# Patient Record
Sex: Male | Born: 1945 | Race: White | Hispanic: No | Marital: Married
Health system: Southern US, Community
[De-identification: ages and names within clinical notes are randomized; demographics above are authoritative.]

## PROBLEM LIST (undated history)

## (undated) DIAGNOSIS — E119 Type 2 diabetes mellitus without complications: Secondary | ICD-10-CM

## (undated) DIAGNOSIS — I10 Essential (primary) hypertension: Secondary | ICD-10-CM

## (undated) DIAGNOSIS — J45909 Unspecified asthma, uncomplicated: Secondary | ICD-10-CM

---

## 2012-11-04 ENCOUNTER — Ambulatory Visit (HOSPITAL_COMMUNITY)
Admission: RE | Admit: 2012-11-04 | Discharge: 2012-11-04 | Disposition: A | Discharge status: Home / Self Care | Payer: Self-pay | Source: Ambulatory Visit | Attending: Preventative Medicine | Admitting: Preventative Medicine

## 2012-11-04 DIAGNOSIS — M25569 Pain in unspecified knee: Secondary | ICD-10-CM | POA: Insufficient documentation

## 2012-11-04 DIAGNOSIS — IMO0001 Reserved for inherently not codable concepts without codable children: Secondary | ICD-10-CM | POA: Insufficient documentation

## 2012-11-04 NOTE — Evaluation (Signed)
Physical Therapy Evaluation  Patient Details  Name: Terry Nguyen MRN: [id number] Date of Birth: [birth year]  Today's Date: 11/04/2012 Time: 0800-0842 PT Time Calculation (min): 42 min Charges: 1 eval, 15' Self Care Visit#: 1  of 4   Re-eval:   Assessment Diagnosis: R knee pain Surgical Date: 10/18/12 Next MD Visit: Dr. Wende Crease - 11/12/11  Authorization: Workers Compensation  Authorization Time Period:    Authorization Visit#: 1  of 4    Subjective Symptoms/Limitations Symptoms: PMH: DMII, GERD, HTN -controlled with medication Pertinent History: Pt is referred to PT for R knee pain which he reports gave way to him on Dec. 16th.  He reports that he was climbing a ladder and it gave way.  His c/co is pain behind the knee.  He continues to work and has been placed on light duty.  He reports that he went to see an orthopedic surgeon in [facility] (@ [facility]) due to increased effusion of his knee. He reports that they were able to draw fluid from his knee.  he reports that they suspect an ACL tear.  How long can you sit comfortably?: no difficulty, no difficulty with driving. How long can you stand comfortably?: uncomfortable by the end of the day.  How long can you walk comfortably?: uncomfortable by the end of the day.  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  Requires: climbing, crawling, walking long distances, standing and painting Comments: He enjoys riding motorcycles.   Cognition/Observation Observation/Other Assessments Observations: comes in with knee brace after seeing his an orthopedic surgeon.  Other Assessments: increased pain with squating and lunges w/improper mechanics, favors R LE.   Sensation/Coordination/Flexibility/Functional Tests Functional  Tests Functional Tests: + R Lachman's Test, lax end feel w/anterior drawer, - varus, - valgus, - apley's compression and distraction, inconclusive McMurray's due to pt unable to relax secondary to pain Functional Tests: LEFS: 44/80  Assessment RLE Assessment RLE Assessment: Within Functional Limits RLE AROM (degrees) RLE Overall AROM Comments: comfort position of knee lacking 5 degrees TKE Right Knee Extension: 0  Right Knee Flexion: 122  LLE Assessment LLE Assessment: Within Functional Limits Palpation Palpation: pain and tenderness to R popliteal fossa and patella.   Exercise/Treatments Stretches Gastroc Stretch: 1 rep;30 seconds Soleus Stretch: 1 rep;30 seconds Standing Heel Raises: 10 reps Functional Squat: 10 reps Other Standing Knee Exercises: Lunges x5 BLE Supine Straight Leg Raises: Right;10 reps  Physical Therapy Assessment and Plan PT Assessment and Plan Clinical Impression Statement: Pt is a 67 year old male referred to PT for R knee pain while climbing a ladder at work. After evaluation it was found that he has significant + tests for ACL laxity Pt will benefit from skilled therapeutic intervention in order to improve on the following deficits: Pain;Improper body mechanics;Impaired perceived functional ability;Abnormal gait Rehab Potential: Fair PT Frequency: Min 3X/week PT Duration: Other (comment) (1 week) PT Treatment/Interventions: Functional mobility training;Therapeutic exercise;Stair training;Balance training;Gait training;Neuromuscular re-education;Patient/family education;Manual techniques;Modalities PT Plan: Focus on improving overall body mechanics with lifting, stair training, squating, reduce pain with modalities and manual techniques as able. Continue to progress strengtheing for knee stability: 4 way SLR, standing TKE, active HS stretch, quad stretch    Goals Home Exercise Program Pt will Perform Home Exercise Program: Independently PT Goal: Perform  Home Exercise Program - Progress: Goal set  today PT Short Term Goals Time to Complete Short Term Goals: Other (comment) (1 week) PT Short Term Goal 1: Pt will report pain less than 3/10 with lunging and squating activities.  PT Short Term Goal 2: Pt will be educated on proper lifting techniques and equipment for safe return to work to decrease knee pain.   Problem List Patient Active Problem List  Diagnosis  . Knee pain   PT Plan of Care PT Home Exercise Plan: see scanned report PT Patient Instructions: importance of HEP, ice and body mechanics, discussed importance of proper kneeling pads and knee pads to decrease knee pain.  Consulted and Agree with Plan of Care: Patient  Annett Fabian, PT 11/04/2012, 4:04 PM  Physician Documentation Your signature is required to indicate approval of the treatment plan as stated above.  Please sign and either send electronically or make a copy of this report for your files and return this physician signed original.   Please mark one 1.__approve of plan  2. ___approve of plan with the following conditions.   ______________________________                                                          _____________________ Physician Signature                                                                                                             Date

## 2012-11-08 ENCOUNTER — Ambulatory Visit (HOSPITAL_COMMUNITY)
Admission: RE | Admit: 2012-11-08 | Discharge: 2012-11-08 | Disposition: A | Discharge status: Home / Self Care | Payer: Self-pay | Source: Ambulatory Visit | Attending: Preventative Medicine | Admitting: Preventative Medicine

## 2012-11-08 NOTE — Progress Notes (Signed)
Physical Therapy Treatment Patient Details  Name: Terry Nguyen MRN: [id number] Date of Birth: 14-Oct-1946  Today's Date: 11/08/2012 Time: 0835-0900 PT Time Calculation (min): 25 min Visit#: 2  of 4   Authorization: Workers Engineer, civil (consulting) Visit#: 2  of 4   Charges:  therex 24'  Subjective: Symptoms/Limitations Symptoms: Pt. states his pain is about the same today.  Currently 3/10 but only with walking; no pain at rest, just discomfort.   Exercise/Treatments Aerobic Stationary Bike: 6' @ 3.0 seat 12 Standing Heel Raises: 10 reps;Limitations Heel Raises Limitations: toeraises 10 reps Forward Lunges: Both;5 reps Lateral Step Up: 10 reps;Step Height: 4";Hand Hold: 0;Right Forward Step Up: 10 reps;Step Height: 4";Hand Hold: 0;Right Step Down: 10 reps;Step Height: 4";Hand Hold: 0;Right Functional Squat: 10 reps Supine Straight Leg Raises: Right;10 reps Sidelying Hip ABduction: 10 reps;Right Hip ADduction: 10 reps;Right Prone  Hip Extension: 10 reps;Right      Physical Therapy Assessment and Plan PT Assessment and Plan Clinical Impression Statement: Pt. able to complete all exercises without c/o pain or difficulty.  Required multimodal cues to complete lunges and squats correctly.  Added new exercises per PT POC; pt. denied need for ice or pain control at end of session. PT Duration:  (1 week) PT Plan: Focus on improving overall body mechanics with lifting and LE strengthening; reduce pain with modalities and manual techniques as needed.Add standing TKE, continue with HS stretch, quad stretch.     Problem List Patient Active Problem List  Diagnosis  . Knee pain    PT - End of Session Activity Tolerance: Patient tolerated treatment well General Behavior During Session: [facility] for tasks performed Cognition: [facility] for tasks performed PT Plan of Care   Lurena Nida, PTA/CLT 11/08/2012, 9:02 AM

## 2012-11-09 ENCOUNTER — Ambulatory Visit (HOSPITAL_COMMUNITY)
Admission: RE | Admit: 2012-11-09 | Discharge: 2012-11-09 | Disposition: A | Discharge status: Home / Self Care | Payer: Self-pay | Source: Ambulatory Visit | Attending: Preventative Medicine | Admitting: Preventative Medicine

## 2012-11-09 NOTE — Progress Notes (Signed)
Physical Therapy Treatment Patient Details  Name: Terry Nguyen MRN: [id number] Date of Birth: 24-Dec-1945  Today's Date: 11/09/2012 Time: 0850-0930 PT Time Calculation (min): 40 min  Visit#: 3  of 4   Re-eval:   Assessment Diagnosis: R knee pain Surgical Date: 10/18/12 Next MD Visit: Dr. Wende Crease - 11/12/11 Charge: therex 38'  Authorization: Workers Compensation  Authorization Time Period:    Authorization Visit#: 3  of 4    Subjective: Symptoms/Limitations Symptoms: Pt stated pain about the same.  Pain scale 3/10 R knee Pain Assessment Currently in Pain?: Yes Pain Score:   3 Pain Location: Knee Pain Orientation: Right  Objective:  Exercise/Treatments Stretches Active Hamstring Stretch: 3 reps;30 seconds Quad Stretch: 3 reps;30 seconds Aerobic Stationary Bike: 8' @ 3.0 seat 10 Standing Heel Raises: 15 reps;Limitations Heel Raises Limitations: toeraises 15 reps Forward Lunges: Both;10 reps Terminal Knee Extension: Both;10 reps;Theraband Theraband Level (Terminal Knee Extension): Level 4 (Blue) Lateral Step Up: 10 reps;Step Height: 4";Hand Hold: 0;Right Forward Step Up: 10 reps;Step Height: 4";Hand Hold: 0;Right Step Down: 10 reps;Step Height: 4";Hand Hold: 0;Right Functional Squat: Limitations Functional Squat Limitations: proper lifting 10x 8# box Supine Bridges: 15 reps Straight Leg Raises: Right;20 reps Sidelying Hip ABduction: Right;15 reps Hip ADduction: Right;15 reps Prone  Hip Extension: Right;15 reps      Physical Therapy Assessment and Plan PT Assessment and Plan Clinical Impression Statement: Pt able to complete all therex with no c/o pain, did require multimodal cueing for proper form/technique with forward lunges and progressed squats to proper lifting for functional strengthening with cueing for form.  Pt denied need to ice or pain control at end of session. PT Plan: Reeval next session per 3 OPPT session per workers comp and prior MD apt on  11/11/2012.  Focus on improving overall body mechanics with lifting and LE strengthening; reduce pain with modalities and manual techniques as needed    Goals    Problem List Patient Active Problem List  Diagnosis  . Knee pain    PT - End of Session Activity Tolerance: Patient tolerated treatment well General Behavior During Session: [facility] for tasks performed Cognition: [facility] for tasks performed  GP    Terry, Nguyen 11/09/2012, 9:43 AM

## 2012-11-10 ENCOUNTER — Ambulatory Visit (HOSPITAL_COMMUNITY)
Admission: RE | Admit: 2012-11-10 | Discharge: 2012-11-10 | Disposition: A | Discharge status: Home / Self Care | Payer: Self-pay | Source: Ambulatory Visit | Attending: Preventative Medicine | Admitting: Preventative Medicine

## 2012-11-10 NOTE — Progress Notes (Signed)
Physical Therapy Re-evaluation/treatment  Patient Details  Name: Terry Nguyen MRN: [id number] Date of Birth: 1946-09-19  Today's Date: 11/10/2012 Time: 0850-0928 PT Time Calculation (min): 38 min  Visit#: 4  of 4   Re-eval:   Assessment Diagnosis: R knee pain Surgical Date: 10/18/12 Next MD Visit: Dr. Wende Crease - 11/12/11 Charge: therex 30', self care 8'  Authorization: Workers Pharmacist, community Time Period:    Authorization Visit#: 4  of 4    Subjective Symptoms/Limitations Symptoms: Pt reported increased knee pain yesterday with exercises. Pain Assessment Currently in Pain?: Yes Pain Score:   4 Pain Location: Knee Pain Orientation: Right  Objective:   Sensation/Coordination/Flexibility/Functional Tests Functional Tests Functional Tests: + R Lachman's Test, lax end feel w/anterior drawer, - varus, - valgus, - apley's compression and distraction Functional Tests: LEFS 47/80 (was 44/80)  Assessment RLE Assessment RLE Assessment: Within Functional Limits  Exercise/Treatments Stretches Active Hamstring Stretch: 3 reps;30 seconds Quad Stretch: 3 reps;30 seconds Gastroc Stretch: 3 reps;30 seconds Aerobic Stationary Bike: 8' @ 3.0 seat 10 Standing Heel Raises: 15 reps;Limitations Heel Raises Limitations: toeraises 15 reps Terminal Knee Extension: Both;10 reps;Theraband Theraband Level (Terminal Knee Extension): Level 4 (Blue) Lateral Step Up: Right;10 reps;Hand Hold: 0;Step Height: 6" Forward Step Up: Right;10 reps;Hand Hold: 0;Step Height: 6" Step Down: Right;10 reps;Hand Hold: 0;Step Height: 6" Functional Squat: Limitations Functional Squat Limitations: proper lifting 10x 8# box    Physical Therapy Assessment and Plan PT Assessment and Plan Clinical Impression Statement: 3rd session per workers' compensation, reviewed PT goals.  Pt continues to require max cueing for proper form with functional squats to reduce stress B knees.  Pt educated on proper form  and technique and stated pain reduced once completeing good form.  Pt with improved perceived LE functional scale by 3 points.  Pt encouraged to increase frequency on HEP for maximum benefits. PT Plan: D/C to HEP per 3rd PT visit, pt to return to MD tomorrow.    Goals Home Exercise Program Pt will Perform Home Exercise Program: Independently PT Goal: Perform Home Exercise Program - Progress: Met (HEP completed daily) PT Short Term Goals Time to Complete Short Term Goals: Other (comment) (1 week) PT Short Term Goal 1: Pt will report pain less than 3/10 with lunging and squating activities.  PT Short Term Goal 1 - Progress: Not met (squatting increase pain to 6-7/10;stated 3/10 with good form) PT Short Term Goal 2: Pt will be educated on proper lifting techniques and equipment for safe return to work to decrease knee pain.  PT Short Term Goal 2 - Progress: Progressing toward goal (requires max cueing for correct form)  Problem List Patient Active Problem List  Diagnosis  . Knee pain    PT - End of Session Activity Tolerance: Patient tolerated treatment well General Behavior During Session: [facility] for tasks performed Cognition: [facility] for tasks performed  GP    TRUE, Garciamartinez 11/10/2012, 11:31 AM  Physician Documentation Your signature is required to indicate approval of the treatment plan as stated above.  Please sign and either send electronically or make a copy of this report for your files and return this physician signed original.   Please mark one 1.__approve of plan  2. ___approve of plan with the following conditions.   ______________________________  _____________________ Physician Signature                                                                                                             Date

## 2012-11-11 ENCOUNTER — Inpatient Hospital Stay (HOSPITAL_COMMUNITY): Admission: RE | Admit: 2012-11-11 | Payer: Self-pay | Source: Ambulatory Visit

## 2012-11-12 ENCOUNTER — Ambulatory Visit (HOSPITAL_COMMUNITY): Payer: Self-pay | Admitting: Physical Therapy

## 2015-02-14 ENCOUNTER — Other Ambulatory Visit: Payer: Self-pay | Admitting: Hematology

## 2015-06-25 ENCOUNTER — Emergency Department (HOSPITAL_COMMUNITY)
Admission: EM | Admit: 2015-06-25 | Discharge: 2015-06-25 | Disposition: A | Discharge status: Home / Self Care | Payer: Medicare (Managed Care) | Attending: Emergency Medicine | Admitting: Emergency Medicine

## 2015-06-25 ENCOUNTER — Emergency Department (HOSPITAL_COMMUNITY): Payer: Medicare (Managed Care)

## 2015-06-25 ENCOUNTER — Encounter (HOSPITAL_COMMUNITY): Payer: Self-pay | Admitting: Emergency Medicine

## 2015-06-25 DIAGNOSIS — N400 Enlarged prostate without lower urinary tract symptoms: Secondary | ICD-10-CM | POA: Diagnosis not present

## 2015-06-25 DIAGNOSIS — I10 Essential (primary) hypertension: Secondary | ICD-10-CM | POA: Diagnosis not present

## 2015-06-25 DIAGNOSIS — R2241 Localized swelling, mass and lump, right lower limb: Secondary | ICD-10-CM | POA: Insufficient documentation

## 2015-06-25 DIAGNOSIS — R109 Unspecified abdominal pain: Secondary | ICD-10-CM

## 2015-06-25 DIAGNOSIS — J45909 Unspecified asthma, uncomplicated: Secondary | ICD-10-CM | POA: Diagnosis not present

## 2015-06-25 DIAGNOSIS — N320 Bladder-neck obstruction: Secondary | ICD-10-CM

## 2015-06-25 DIAGNOSIS — E119 Type 2 diabetes mellitus without complications: Secondary | ICD-10-CM | POA: Diagnosis not present

## 2015-06-25 DIAGNOSIS — R110 Nausea: Secondary | ICD-10-CM

## 2015-06-25 DIAGNOSIS — Z880 Allergy status to penicillin: Secondary | ICD-10-CM | POA: Insufficient documentation

## 2015-06-25 HISTORY — DX: Unspecified asthma, uncomplicated: J45.909

## 2015-06-25 HISTORY — DX: Essential (primary) hypertension: I10

## 2015-06-25 HISTORY — DX: Type 2 diabetes mellitus without complications: E11.9

## 2015-06-25 LAB — CBC WITH DIFFERENTIAL/PLATELET
BASOS ABS: 0 10*3/uL (ref 0.0–0.1)
BASOS PCT: 0 % (ref 0–1)
EOS ABS: 0.1 10*3/uL (ref 0.0–0.7)
EOS PCT: 1 % (ref 0–5)
HCT: 47.8 % (ref 39.0–52.0)
Hemoglobin: 17.3 g/dL — ABNORMAL HIGH (ref 13.0–17.0)
Lymphocytes Relative: 19 % (ref 12–46)
Lymphs Abs: 1.5 10*3/uL (ref 0.7–4.0)
MCH: 31.1 pg (ref 26.0–34.0)
MCHC: 36.2 g/dL — ABNORMAL HIGH (ref 30.0–36.0)
MCV: 86 fL (ref 78.0–100.0)
MONO ABS: 0.5 10*3/uL (ref 0.1–1.0)
Monocytes Relative: 6 % (ref 3–12)
Neutro Abs: 5.9 10*3/uL (ref 1.7–7.7)
Neutrophils Relative %: 74 % (ref 43–77)
PLATELETS: 205 10*3/uL (ref 150–400)
RBC: 5.56 MIL/uL (ref 4.22–5.81)
RDW: 13.3 % (ref 11.5–15.5)
WBC: 8 10*3/uL (ref 4.0–10.5)

## 2015-06-25 LAB — URINE MICROSCOPIC-ADD ON

## 2015-06-25 LAB — URINALYSIS, ROUTINE W REFLEX MICROSCOPIC
BILIRUBIN URINE: NEGATIVE
Glucose, UA: >1000 mg/dL — AB
HGB URINE DIPSTICK: NEGATIVE
KETONES UR: NEGATIVE mg/dL
Leukocytes, UA: NEGATIVE
Nitrite: NEGATIVE
Protein, ur: NEGATIVE mg/dL
SPECIFIC GRAVITY, URINE: 1.024 (ref 1.005–1.030)
UROBILINOGEN UA: 0.2 mg/dL (ref 0.0–1.0)
pH: 6 (ref 5.0–8.0)

## 2015-06-25 LAB — COMPREHENSIVE METABOLIC PANEL
ALBUMIN: 3.7 g/dL (ref 3.5–5.0)
ALK PHOS: 95 U/L (ref 38–126)
ALT: 23 U/L (ref 17–63)
AST: 19 U/L (ref 15–41)
Anion gap: 11 (ref 5–15)
BILIRUBIN TOTAL: 0.7 mg/dL (ref 0.3–1.2)
BUN: 9 mg/dL (ref 6–20)
CALCIUM: 9.1 mg/dL (ref 8.9–10.3)
CO2: 24 mmol/L (ref 22–32)
CREATININE: 0.81 mg/dL (ref 0.61–1.24)
Chloride: 99 mmol/L — ABNORMAL LOW (ref 101–111)
GFR calc Af Amer: >60 mL/min (ref >60)
GLUCOSE: 340 mg/dL — AB (ref 65–99)
Potassium: 4.3 mmol/L (ref 3.5–5.1)
Sodium: 134 mmol/L — ABNORMAL LOW (ref 135–145)
TOTAL PROTEIN: 7.4 g/dL (ref 6.5–8.1)

## 2015-06-25 LAB — TROPONIN I

## 2015-06-25 LAB — LIPASE, BLOOD: LIPASE: 42 U/L (ref 22–51)

## 2015-06-25 LAB — CBG MONITORING, ED: Glucose-Capillary: 296 mg/dL — ABNORMAL HIGH (ref 65–99)

## 2015-06-25 MED ORDER — PANTOPRAZOLE SODIUM 40 MG IV SOLR
40.0000 mg | Freq: Once | INTRAVENOUS | Status: AC
  Administered 2015-06-25: 40 mg via INTRAVENOUS
  Filled 2015-06-25: qty 40

## 2015-06-25 MED ORDER — PANTOPRAZOLE SODIUM 20 MG PO TBEC: 20.0000 mg | DELAYED_RELEASE_TABLET | Freq: Every day | ORAL | Status: AC

## 2015-06-25 MED ORDER — METOCLOPRAMIDE HCL 5 MG/ML IJ SOLN
10.0000 mg | Freq: Once | INTRAMUSCULAR | Status: AC
  Administered 2015-06-25: 10 mg via INTRAVENOUS
  Filled 2015-06-25: qty 2

## 2015-06-25 MED ORDER — SODIUM CHLORIDE 0.9 % IV BOLUS (SEPSIS)
1000.0000 mL | Freq: Once | INTRAVENOUS | Status: AC
  Administered 2015-06-25: 1000 mL via INTRAVENOUS

## 2015-06-25 MED ORDER — IOHEXOL 300 MG/ML  SOLN
100.0000 mL | Freq: Once | INTRAMUSCULAR | Status: AC | PRN
  Administered 2015-06-25: 100 mL via INTRAVENOUS

## 2015-06-25 MED ORDER — ONDANSETRON HCL 4 MG/2ML IJ SOLN
4.0000 mg | Freq: Once | INTRAMUSCULAR | Status: AC
  Administered 2015-06-25: 4 mg via INTRAVENOUS
  Filled 2015-06-25: qty 2

## 2015-06-25 MED ORDER — METOCLOPRAMIDE HCL 10 MG PO TABS: 10.0000 mg | ORAL_TABLET | Freq: Four times a day (QID) | ORAL | Status: AC

## 2015-06-25 MED ORDER — TAMSULOSIN HCL 0.4 MG PO CAPS: 0.4000 mg | ORAL_CAPSULE | Freq: Every day | ORAL | Status: AC

## 2015-06-25 MED ORDER — GI COCKTAIL ~~LOC~~
30.0000 mL | Freq: Once | ORAL | Status: AC
  Administered 2015-06-25: 30 mL via ORAL
  Filled 2015-06-25: qty 30

## 2015-06-25 NOTE — ED Provider Notes (Signed)
CSN: [id number]     Arrival date & time 06/25/15  4098 History   First MD Initiated Contact with Patient 06/25/15 1106     Chief Complaint  Patient presents with  . Abdominal Pain     (Consider location/radiation/quality/duration/timing/severity/associated sxs/prior Treatment) The history is provided by the patient.     Pt with hx DM, HTN, HLD, GERD p/w 3 weeks of nausea,dysuria, urinary frequency.  Initially had a cough that is now more mild, occasionally productive white sputum.  Feels that there is something caught in his chest when he eats, sensation of something constantly being in his chest, worse with eating.  Developed periumbilical pain in the past week.  Has had ten pound weight loss this month.  Has also had chills.   Was seen by PCP last week and started on Levaquin for presumed pulmonary and urinary infections, but it has not helped.   Denies recent travel, leg swelling.  Has been bitten by and has pulled off several ticks this summer.  No rashes noted. Denies fevers, SOB.    PCP Dr Army Fossa    Past Medical History  Diagnosis Date  . Diabetes mellitus without complication   . Hypertension   . Asthma    No past surgical history on file. No family history on file. Social History  Substance Use Topics  . Smoking status: Not on file  . Smokeless tobacco: Not on file  . Alcohol Use: Not on file    Review of Systems  All other systems reviewed and are negative.     Allergies  Penicillins  Home Medications   Prior to Admission medications   Not on File   BP 150/74 mmHg  Pulse 65  Temp(Src) 97.9 F (36.6 C) (Oral)  Resp 15  Ht  (1.803 m)  Wt 200 lb (90.719 kg)  BMI 27.91 kg/m2  SpO2 98% Physical Exam  Constitutional: He appears well-developed and well-nourished. No distress.  HENT:  Head: Normocephalic and atraumatic.  Eyes: Conjunctivae are normal.  Neck: Neck supple.  Cardiovascular: Normal rate and regular rhythm.   Pulmonary/Chest:  Effort normal. No respiratory distress. He has no decreased breath sounds. He has no wheezes. He has rhonchi. He has no rales.  Ronchi on left, somewhat clears with cough  Abdominal: Soft. He exhibits no distension and no mass. There is tenderness in the right upper quadrant. There is no rebound and no guarding.  Neurological: He is alert. He exhibits normal muscle tone.  Skin: He is not diaphoretic.  Nursing note and vitals reviewed.   ED Course  Procedures (including critical care time) Labs Review Labs Reviewed  COMPREHENSIVE METABOLIC PANEL - Abnormal; Notable for the following:    Sodium 134 (*)    Chloride 99 (*)    Glucose, Bld 340 (*)    All other components within normal limits  CBC WITH DIFFERENTIAL/PLATELET - Abnormal; Notable for the following:    Hemoglobin 17.3 (*)    MCHC 36.2 (*)    All other components within normal limits  URINALYSIS, ROUTINE W REFLEX MICROSCOPIC (NOT AT [facility]) - Abnormal; Notable for the following:    Glucose, UA >1000 (*)    All other components within normal limits  URINE MICROSCOPIC-ADD ON - Abnormal; Notable for the following:    Casts HYALINE CASTS (*)    All other components within normal limits  CBG MONITORING, ED - Abnormal; Notable for the following:    Glucose-Capillary 296 (*)    All other  components within normal limits  LIPASE, BLOOD  TROPONIN I    Imaging Review Dg Chest 2 View  06/25/2015   CLINICAL DATA:  Productive cough for 3 weeks. Right upper quadrant pain.  EXAM: CHEST  2 VIEW  COMPARISON:  None.  FINDINGS: Mild peribronchial thickening and interstitial prominence. Heart is normal size. No effusions or confluent opacities. No acute bony abnormality.  IMPRESSION: Mild bronchitic changes.   Electronically Signed   By: Charlett Nose M.D.   On: 06/25/2015 12:24   Ct Abdomen Pelvis W Contrast  06/25/2015   CLINICAL DATA:  Right upper quadrant tenderness with nausea, weight loss, and periumbilical pain.  EXAM: CT ABDOMEN AND  PELVIS WITH CONTRAST  TECHNIQUE: Multidetector CT imaging of the abdomen and pelvis was performed using the standard protocol following bolus administration of intravenous contrast.  CONTRAST:  OMNIPAQUE IOHEXOL 300 MG/ML  SOLN  COMPARISON:  None.  FINDINGS: BODY WALL: Large fatty right inguinal hernia, partly imaged.  LOWER CHEST: No contributory findings.  ABDOMEN/PELVIS:  Liver: No focal abnormality.  Biliary: No evidence of biliary obstruction or stone.  Pancreas: Mild fat reticulation around the uncinate process, where there also fine calcifications. No fluid collection or duct enlargement.  Spleen: Borderline enlarged at 14 x 4 x 11 cm. No focal abnormality.  Adrenals: Thick appearance bilaterally without discrete nodule.  Kidneys and ureters: Some renal hilar calcifications are arterial, but there are definite urinary stones in the lower pole left kidney, nonobstructive. 1 cm cyst in the interpolar right kidney. No hydronephrosis or ureteral calculus.  Bladder: Overdistended bladder with thickened appearance of the wall circumferentially and mild surrounding fat haziness.  Reproductive: Enlargement of the central gland, projecting into the bladder.  Bowel: No obstruction. No inflammatory change.  Retroperitoneum: Enlargement of deep liver in lymph nodes, usually reactive and incidental when seen in isolation.  Peritoneum: No ascites or pneumoperitoneum.  Vascular: Extensive atherosclerotic calcification of the aorta, iliacs, and branch vessels.  OSSEOUS: Partly imaged 6 by 4 cm fatty mass in the posterior compartment of the right thigh with partly imaged calcified septation.  Bilateral L5 pars defects with low grade 1 anterolisthesis. Remote L1 superior endplate fracture. Facet arthropathy is bulky and diffuse.  IMPRESSION: 1. Possible mild pancreatitis, correlate with serum enzymes. 2. Over distended urinary bladder consistent with outlet obstruction in this patient with prostate enlargement. 3. Large  fatty right inguinal hernia. 4. Nonobstructive nephrolithiasis at least on the left. 5. Partly imaged fatty mass in the posterior compartment right thigh, at least 6 cm. Due to internal calcification, CT or MR is recommended to characterize complexity.   Electronically Signed   By: Marnee Spring M.D.   On: 06/25/2015 13:53   I have personally reviewed and evaluated these images and lab results as part of my medical decision-making.   EKG Interpretation None       11:55 AM Discussed pt with Dr Clayborne Dana who will also see the patient.   3:35 PM Pt reports he is feeling much better.  Discussed all results with patient and his son (with patient's permission).    4:46 PM  Discussed management with Dr Clayborne Dana and with the patient.  Pt feels that he is able to urinate and sometimes urinates a lot, sometimes doesn't.  He declines having a foley at this time, even for drainage to see if it provides any relief.  I will refer him to urology.  Postvoid residual performed earlier today was approximately 300 per verbal report.  5:02 PM Post void residual is 440cc.  I have again recommended foley catheter to the patient and he has refused.  I have made him aware that he is welcome to return at any time if he changes his mind.  I will start him on Flomax and have him follow up with the urologist.    MDM   Final diagnoses:  Abdominal pain, unspecified abdominal location  Nausea  Enlarged prostate  Bladder outlet obstruction  Mass of right thigh    Afebrile nontoxic patient with 3 weeks of dysuria, urinary frequency, nausea, abdominal pain.  Found to have enlarged prostate causing bladder outlet obstruction, 440cc postvoid residual.  Pt has refused foley catheter.  He is still able to void on his own.  UA does not appear infected, pt is taking Levaquin.  Pt made aware of abnormalities on CT abd/pelvis, I have advised that he follow up with his PCP for further investigation.  CT does show possible mild  pancreatitis but lipase is normal and symptoms not entirely aligned with pancreatitis.  I do suspect pt has uncontrolled acid reflux and will start protonix on this patient.  D/C home with reglan, protonix, flomax.  Discussed result, findings, treatment, and follow up  with patient.  Pt given return precautions.  Pt verbalizes understanding and agrees with plan.        Trixie Dredge, PA-C 06/25/15 1711  Marily Memos, MD 06/25/15 [id number]

## 2015-06-25 NOTE — ED Notes (Signed)
Patient transported to CT 

## 2015-06-25 NOTE — ED Notes (Signed)
PA at the bedside. Made aware of GU assessment.

## 2015-06-25 NOTE — ED Notes (Signed)
Pt refused a foley.

## 2015-06-25 NOTE — ED Notes (Signed)
Pt dx with "lung and bladder infection" by PCP. Was put on antibiotics Levo and has 3 left to take. Reports he is just staying so sick and nauseated and belly pain. Denies diarr. States no one can seem to find out whats going on with him.

## 2015-06-25 NOTE — Discharge Instructions (Signed)
Read the information below.  Use the prescribed medication as directed.  Please discuss all new medications with your pharmacist.  You may return to the Emergency Department at any time for worsening condition or any new symptoms that concern you.  If you develop high fevers, worsening abdominal pain, uncontrolled vomiting, or are unable to tolerate fluids by mouth, or are unable to urinate, return to the ER for a recheck.

## 2015-06-25 NOTE — ED Notes (Signed)
Pt has felt nauseated for about three weeks. No vomiting recently. BM's are normal.

## 2016-02-03 IMAGING — CT CT ABD-PELV W/ CM
2 of 5 series · 15 of 46 positions shown, 17 images · IV contrast (Omni 300)
Comparison: None.

CLINICAL DATA: Right upper quadrant tenderness with nausea, weight
loss, and periumbilical pain.

EXAM:
CT ABDOMEN AND PELVIS WITH CONTRAST
TECHNIQUE: Multidetector CT imaging of the abdomen and pelvis was performed
using the standard protocol following bolus administration of
intravenous contrast.
CONTRAST:  100mL OMNIPAQUE IOHEXOL 300 MG/ML  SOLN

[Series 2: abd/ pelvis 5.0 i30f 1 · axial · 0.85mm/px · z∈[-1276,-826]mm · 12 of 100 slices shown, 14 images]
[im 5/100  soft-tissue]
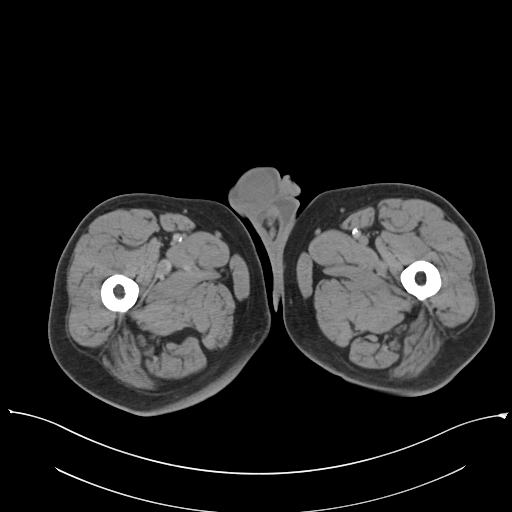
[im 5/100  bone]
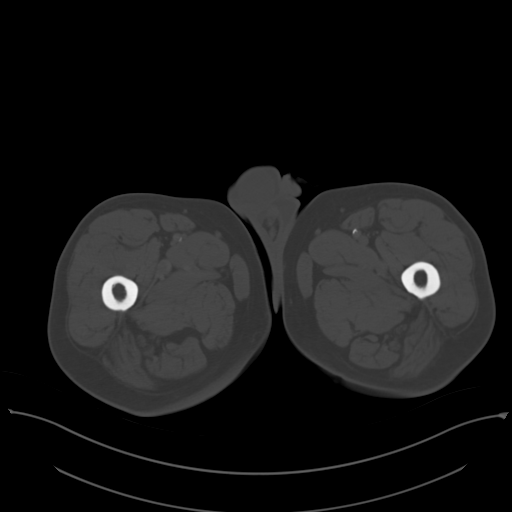
[im 15/100  soft-tissue]
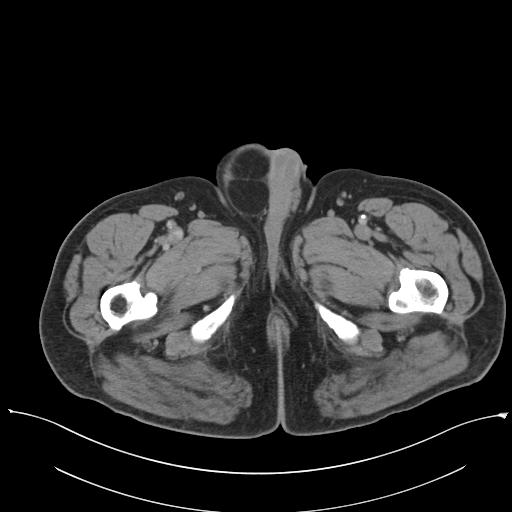
[im 24/100  soft-tissue]
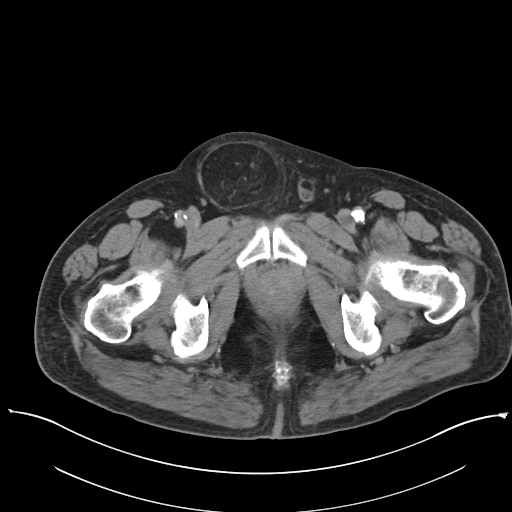
[im 29/100  soft-tissue]
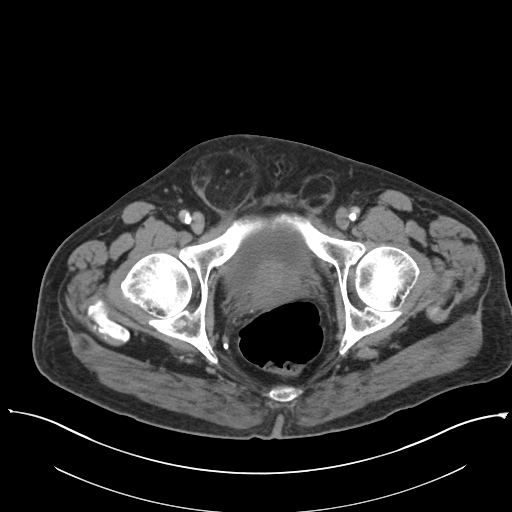
[im 38/100  soft-tissue]
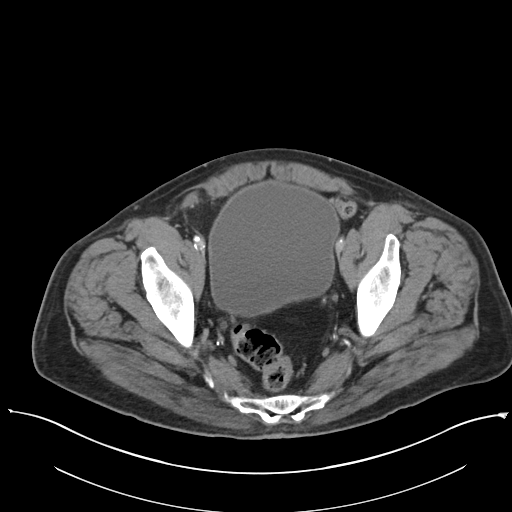
[im 48/100  soft-tissue]
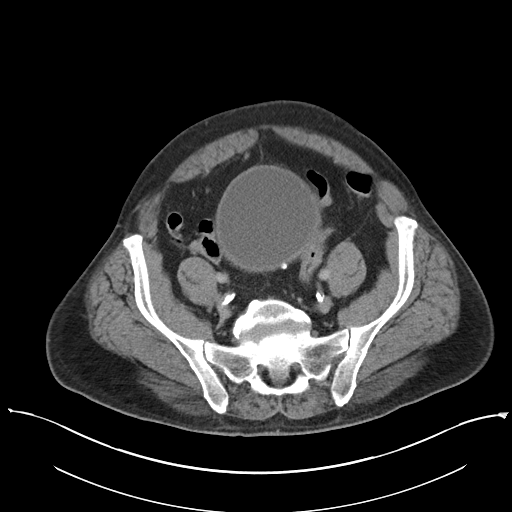
[im 52/100  soft-tissue]
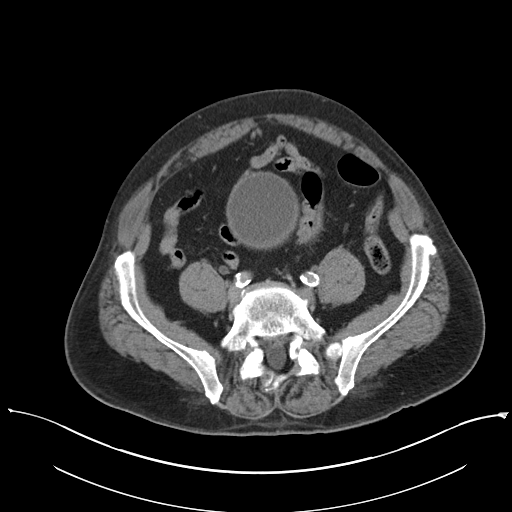
[im 62/100  soft-tissue]
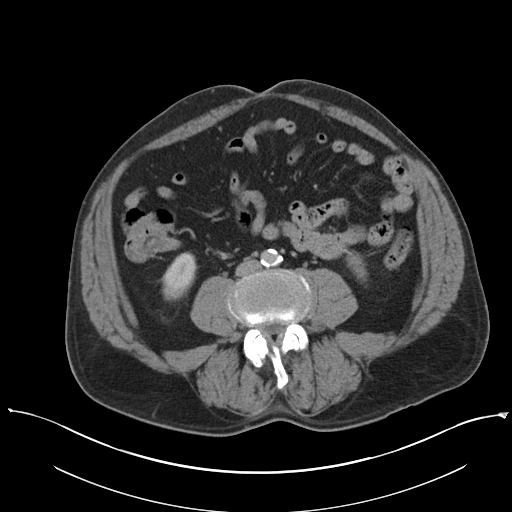
[im 71/100  soft-tissue]
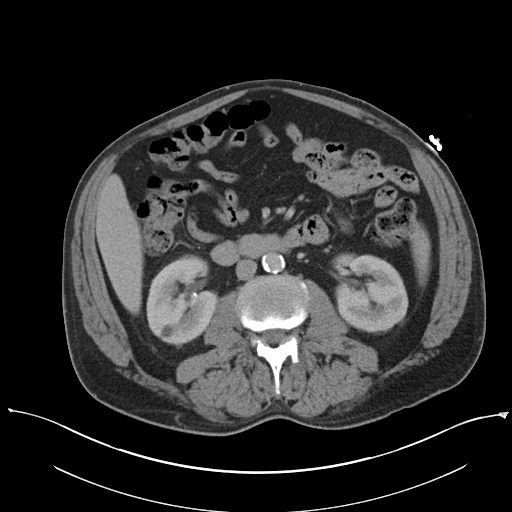
[im 71/100  bone]
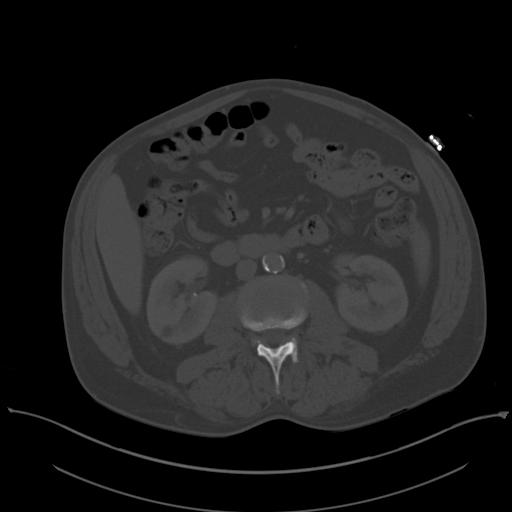
[im 76/100  soft-tissue]
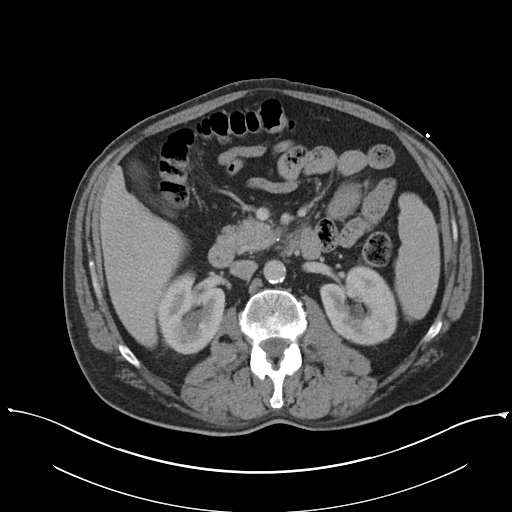
[im 85/100  soft-tissue]
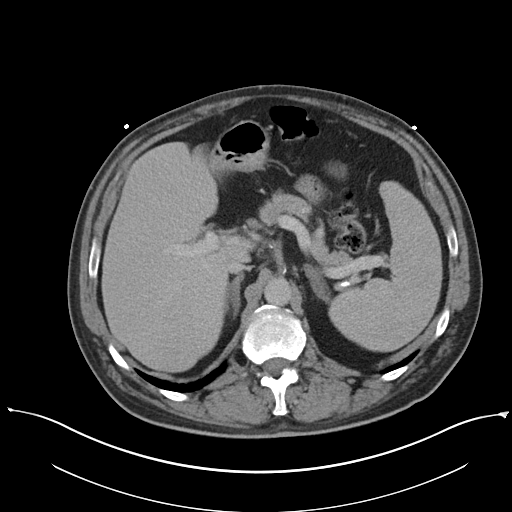
[im 95/100  soft-tissue]
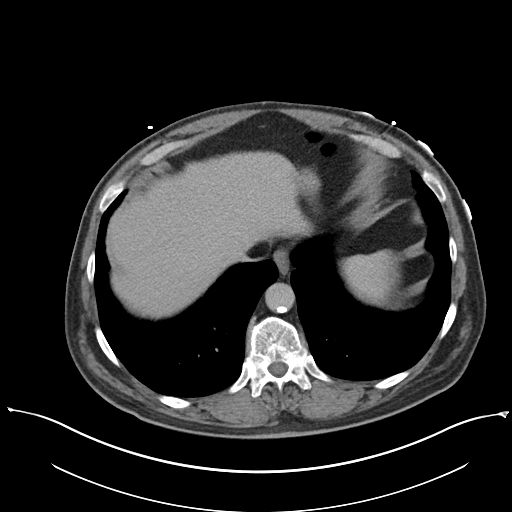

[Series 5: coronals · coronal · 0.79mm/px · 3 of 149 slices shown]
[im 50/149  soft-tissue]
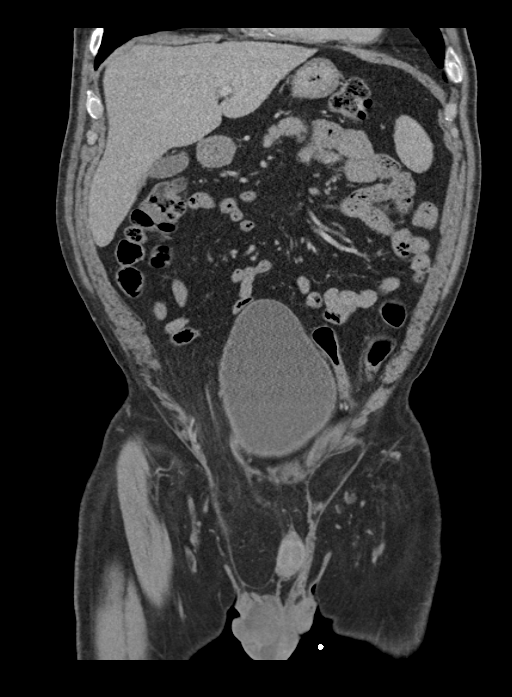
[im 66/149  soft-tissue]
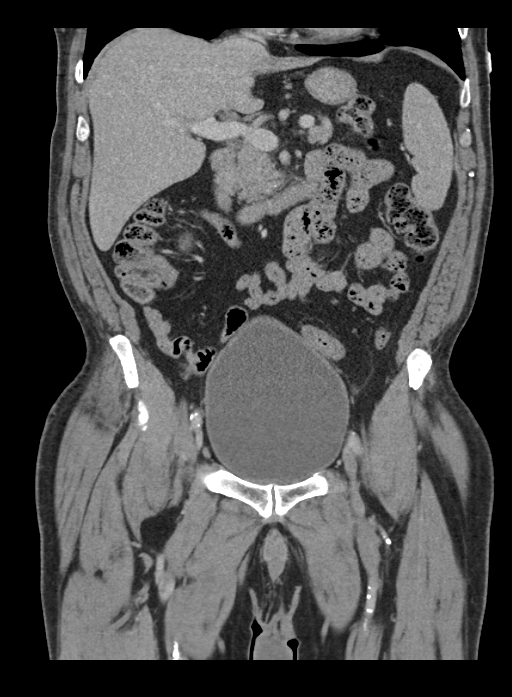
[im 83/149  soft-tissue]
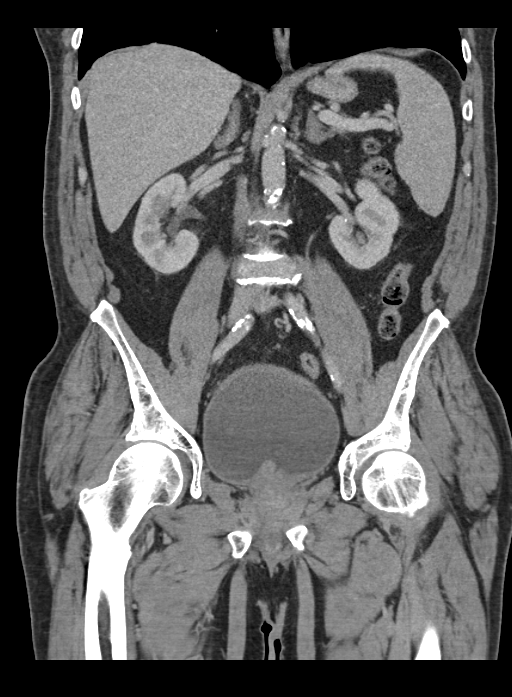

[15 of 46 positions shown; findings below may reference images not displayed]

FINDINGS: BODY WALL: Large fatty right inguinal hernia, partly imaged.

LOWER CHEST: No contributory findings.

ABDOMEN/PELVIS:

Liver: No focal abnormality.

Biliary: No evidence of biliary obstruction or stone.

Pancreas: Mild fat reticulation around the uncinate process, where
there also fine calcifications. No fluid collection or duct
enlargement.

Spleen: Borderline enlarged at 14 x 4 x 11 cm. No focal abnormality.

Adrenals: Thick appearance bilaterally without discrete nodule.

Kidneys and ureters: Some renal hilar calcifications are arterial,
but there are definite urinary stones in the lower pole left kidney,
nonobstructive. 1 cm cyst in the interpolar right kidney. No
hydronephrosis or ureteral calculus.

Bladder: Overdistended bladder with thickened appearance of the wall
circumferentially and mild surrounding fat haziness.

Reproductive: Enlargement of the central gland, projecting into the
bladder.

Bowel: No obstruction. No inflammatory change.

Retroperitoneum: Enlargement of deep liver in lymph nodes, usually
reactive and incidental when seen in isolation.

Peritoneum: No ascites or pneumoperitoneum.

Vascular: Extensive atherosclerotic calcification of the aorta,
iliacs, and branch vessels.

OSSEOUS: Partly imaged 6 by 4 cm fatty mass in the posterior
compartment of the right thigh with partly imaged calcified
septation.

Bilateral L5 pars defects with low grade 1 anterolisthesis. Remote
L1 superior endplate fracture. Facet arthropathy is bulky and
diffuse.
IMPRESSION: 1. Possible mild pancreatitis, correlate with serum enzymes.
2. Over distended urinary bladder consistent with outlet obstruction
in this patient with prostate enlargement.
3. Large fatty right inguinal hernia.
4. Nonobstructive nephrolithiasis at least on the left.
5. Partly imaged fatty mass in the posterior compartment right
thigh, at least 6 cm. Due to internal calcification, CT or MR is
recommended to characterize complexity.

## 2022-08-12 ENCOUNTER — Encounter: Payer: Medicare (Managed Care) | Admitting: Physical Therapy

## 2022-12-22 DIAGNOSIS — Z1330 Encounter for screening examination for mental health and behavioral disorders, unspecified: Secondary | ICD-10-CM | POA: Diagnosis not present

## 2022-12-22 DIAGNOSIS — Z48812 Encounter for surgical aftercare following surgery on the circulatory system: Secondary | ICD-10-CM | POA: Diagnosis not present

## 2022-12-22 DIAGNOSIS — Z89511 Acquired absence of right leg below knee: Secondary | ICD-10-CM | POA: Diagnosis not present

## 2022-12-22 DIAGNOSIS — I739 Peripheral vascular disease, unspecified: Secondary | ICD-10-CM | POA: Diagnosis not present

## 2022-12-22 DIAGNOSIS — E1151 Type 2 diabetes mellitus with diabetic peripheral angiopathy without gangrene: Secondary | ICD-10-CM | POA: Diagnosis not present

## 2023-08-27 DIAGNOSIS — E039 Hypothyroidism, unspecified: Secondary | ICD-10-CM | POA: Diagnosis not present

## 2023-08-27 DIAGNOSIS — I10 Essential (primary) hypertension: Secondary | ICD-10-CM | POA: Diagnosis not present

## 2023-08-27 DIAGNOSIS — E7800 Pure hypercholesterolemia, unspecified: Secondary | ICD-10-CM | POA: Diagnosis not present

## 2023-12-11 DIAGNOSIS — E039 Hypothyroidism, unspecified: Secondary | ICD-10-CM | POA: Diagnosis not present

## 2023-12-11 DIAGNOSIS — E119 Type 2 diabetes mellitus without complications: Secondary | ICD-10-CM | POA: Diagnosis not present

## 2023-12-11 DIAGNOSIS — Z794 Long term (current) use of insulin: Secondary | ICD-10-CM | POA: Diagnosis not present

## 2023-12-11 DIAGNOSIS — I10 Essential (primary) hypertension: Secondary | ICD-10-CM | POA: Diagnosis not present

## 2023-12-11 DIAGNOSIS — I480 Paroxysmal atrial fibrillation: Secondary | ICD-10-CM | POA: Diagnosis not present

## 2023-12-11 DIAGNOSIS — E785 Hyperlipidemia, unspecified: Secondary | ICD-10-CM | POA: Diagnosis not present

## 2024-03-11 NOTE — Telephone Encounter (Signed)
 Copied from CRM #[id number]. Topic: Medication/Rx - Rx Refill >> Mar 11, 2024  9:54 AM Eligah PEDLAR wrote: Terry Nguyen called to request a medication listed on medication list.Medication is on med list and refill has not been requested. Patient refused to contact pharmacy, declined appointment or pharmacy requested message to provider. Sent message to Clinical Pool and advised caller of 3 business SLA; marked high priority if caller stated they are out of medication. [facility] -  Anita Motto >> Mar 11, 2024  9:55 AM Eligah PEDLAR wrote: Terry Nguyen is calling for medication request (Obtain: Medication Name, Dosage, Quantity, Frequency, Quantity Requested (example: 30-day supply.)   Include all details related to the request(s) below:  .  oxyCODONE-acetaminophen (PERCOCET) 10-325 mg per tablet, Take 1 tablet by mouth every 4 (four) hours as needed for moderate pain (4-6) or severe pain (7-10)., Disp: 120 tablet, Rfl: 0   Confirm and type the Best Contact Number below:  Patient/caller contact number:   [phone]           [] Home  [x] Mobile  [] Work  []  Other   [x]  Okay to leave a voicemail   Medication List:  Current Outpatient Medications:  .  amiodarone (PACERONE) 200 mg tablet, TAKE 1 TABLET BY MOUTH EVERY DAY, Disp: 90 tablet, Rfl: 0 .  amLODIPine (NORVASC) 5 mg tablet, 5 mg daily., Disp: , Rfl:  .  atorvastatin (LIPITOR) 80 mg tablet, Take 80 mg by mouth daily. FOR CHOLESTEROL, Disp: , Rfl:  .  ezetimibe (ZETIA) 10 mg tablet, Take 10 mg by mouth daily., Disp: , Rfl:  .  FreeStyle Libre 2 Sensor kit, USE AS DIRECTED. CHANGE EVERY 14 DAYS, Disp: , Rfl:  .  furosemide (LASIX) 20 mg tablet, Take 1 tablet (20 mg total) by mouth daily., Disp: 90 tablet, Rfl: 0 .  HumuLIN 70/30 U-100 Insulin 100 unit/mL (70-30) injection, INJECT 45 UNITS IN THE MORNING AND 35 UNITS IN THE EVENING AS DIRECTED, Disp: , Rfl:  .  levothyroxine (SYNTHROID) 125 mcg tablet, Take 125 mcg by mouth  every morning., Disp: , Rfl:  .  magnesium oxide 400 mg (241 mg magnesium) tab, Take 400 mg by mouth daily., Disp: , Rfl:  .  naloxone (NARCAN) 4 mg/actuation spry nasal spray, Spray the contents of 1 device into 1 nostril. Call 911. May repeat with 2nd device in alternate nostril if no response in 2-3 minutes., Disp: 2 each, Rfl: 0 .  naloxone (Narcan) 4 mg/actuation spry nasal spray, Spray the contents of 1 device into 1 nostril. Call 911. May repeat with 2nd device in alternate nostril if no response in 2-3 minutes., Disp: 2 each, Rfl: 0 .  omeprazole (PriLOSEC) 20 mg DR capsule, Take 1 capsule by mouth daily., Disp: , Rfl:  .  oxyCODONE-acetaminophen (PERCOCET) 10-325 mg per tablet, Take 1 tablet by mouth every 4 (four) hours as needed for moderate pain (4-6) or severe pain (7-10)., Disp: 120 tablet, Rfl: 0 .  potassium chloride (KLOR-CON) 20 mEq packet, ATKE 1 (ONE) PACKET TWO TIMES DAILY AFTER MEALS FOR POTASSIUM, Disp: , Rfl:  .  tamsulosin  (FLOMAX ) 0.4 mg cap, Take 0.4 mg by mouth daily., Disp: , Rfl:  .  Xarelto 20 mg tablet, Take 1 tablet (20 mg total) by mouth daily with dinner., Disp: 90 tablet, Rfl: 1     Medication Request/Refills: Pharmacy Information (if applicable)   []  Not Applicable       []  Pharmacy listed  Send  Medication Request to:[id number] [place], [place] - [address] - PHONE: [phone] - FAX: [phone]                                                 []  Pharmacy not listed (added to pharmacy list in Epic) Send Medication Request to:      Listed Pharmacies: [id number] - [place], [place] - [address] - PHONE: [phone] - FAX: [phone]

## 2024-04-11 NOTE — Telephone Encounter (Signed)
 Copied from CRM #[id number]. Topic: Medication/Rx - Rx Refill >> Apr 11, 2024  9:01 AM Brittany S wrote: Terry Nguyen called to request a medication listed on medication list.Medication is on med list and refill has not been requested. Patient refused to contact pharmacy, declined appointment or pharmacy requested message to provider. Sent message to Clinical Pool and advised caller of 3 business SLA; marked high priority if caller stated they are out of medication. [facility]  Anita Motto >> Apr 11, 2024  9:03 AM Brittany S wrote: Terry Nguyen is calling for medication request (Obtain: Medication Name, Dosage, Quantity, Frequency, Quantity Requested (example: 30-day supply.)   Include all details related to the request(s) below: Requesting refill on   amLODIPine (NORVASC) 5 mg tablet, 5 mg daily., Disp: , Rfl:   .  atorvastatin (LIPITOR) 80 mg tablet, Take 80 mg by mouth daily. FOR CHOLESTEROL, Disp: , Rfl:  oxyCODONE-acetaminophen (PERCOCET) 10-325 mg per tablet [[id number]]  ENDED     Confirm and type the Best Contact Number below:  Patient/caller contact number:       Terry Nguyen [phone]      [] Home  [] Mobile  [] Work  []  Other   [x]  Okay to leave a voicemail   Medication List:  Current Outpatient Medications:  .  amiodarone (PACERONE) 200 mg tablet, TAKE 1 TABLET BY MOUTH EVERY DAY, Disp: 90 tablet, Rfl: 0 .  amLODIPine (NORVASC) 5 mg tablet, 5 mg daily., Disp: , Rfl:  .  atorvastatin (LIPITOR) 80 mg tablet, Take 80 mg by mouth daily. FOR CHOLESTEROL, Disp: , Rfl:  .  ezetimibe (ZETIA) 10 mg tablet, Take 10 mg by mouth daily., Disp: , Rfl:  .  FreeStyle Libre 2 Sensor kit, USE AS DIRECTED. CHANGE EVERY 14 DAYS, Disp: 2 kit, Rfl: 2 .  furosemide (LASIX) 20 mg tablet, Take 1 tablet (20 mg total) by mouth daily., Disp: 90 tablet, Rfl: 0 .  HumuLIN 70/30 U-100 Insulin 100 unit/mL (70-30) injection, INJECT 45 UNITS IN THE MORNING AND 35 UNITS IN THE EVENING AS  DIRECTED, Disp: , Rfl:  .  levothyroxine (SYNTHROID) 125 mcg tablet, Take 125 mcg by mouth every morning., Disp: , Rfl:  .  magnesium oxide 400 mg (241 mg magnesium) tab, Take 400 mg by mouth daily., Disp: , Rfl:  .  naloxone (NARCAN) 4 mg/actuation spry nasal spray, Spray the contents of 1 device into 1 nostril. Call 911. May repeat with 2nd device in alternate nostril if no response in 2-3 minutes., Disp: 2 each, Rfl: 0 .  naloxone (Narcan) 4 mg/actuation spry nasal spray, Spray the contents of 1 device into 1 nostril. Call 911. May repeat with 2nd device in alternate nostril if no response in 2-3 minutes., Disp: 2 each, Rfl: 0 .  omeprazole (PriLOSEC) 20 mg DR capsule, Take 1 capsule by mouth daily., Disp: , Rfl:  .  potassium chloride (KLOR-CON) 20 mEq packet, ATKE 1 (ONE) PACKET TWO TIMES DAILY AFTER MEALS FOR POTASSIUM, Disp: , Rfl:  .  tamsulosin  (FLOMAX ) 0.4 mg cap, Take 0.4 mg by mouth daily., Disp: , Rfl:  .  Xarelto 20 mg tablet, Take 1 tablet (20 mg total) by mouth daily with dinner., Disp: 90 tablet, Rfl: 1     Medication Request/Refills: Pharmacy Information (if applicable)   []  Not Applicable       [x]  Pharmacy listed  Send Medication Request to:[id number] [place], [place] - [address] - PHONE: [phone] -  FAX: [phone]                                                 []  Pharmacy not listed (added to pharmacy list in Epic) Send Medication Request to:      Listed Pharmacies: [id number] [place], [place] - [address] - PHONE: [phone] - FAX: [phone] [facility] [place], [place] - [address] - PHONE: [phone] - FAX: [phone]

## 2024-04-14 NOTE — Telephone Encounter (Signed)
 Patient calling to follow up on med refill request for oxyCODONE-acetaminophen (PERCOCET) 10-325 mg per tablet [[id number]]. Patient shares he is in sever apin and needs his medication.   Call back: [phone]  [facility] to leave message on call back number

## 2024-04-19 DIAGNOSIS — G546 Phantom limb syndrome with pain: Secondary | ICD-10-CM | POA: Diagnosis not present

## 2024-04-19 DIAGNOSIS — E1122 Type 2 diabetes mellitus with diabetic chronic kidney disease: Secondary | ICD-10-CM | POA: Diagnosis not present

## 2024-04-19 DIAGNOSIS — N401 Enlarged prostate with lower urinary tract symptoms: Secondary | ICD-10-CM | POA: Diagnosis not present

## 2024-04-19 DIAGNOSIS — Z794 Long term (current) use of insulin: Secondary | ICD-10-CM | POA: Diagnosis not present

## 2024-04-19 DIAGNOSIS — E785 Hyperlipidemia, unspecified: Secondary | ICD-10-CM | POA: Diagnosis not present

## 2024-04-19 DIAGNOSIS — Z0000 Encounter for general adult medical examination without abnormal findings: Secondary | ICD-10-CM | POA: Diagnosis not present

## 2024-04-19 DIAGNOSIS — I2510 Atherosclerotic heart disease of native coronary artery without angina pectoris: Secondary | ICD-10-CM | POA: Diagnosis not present

## 2024-04-19 DIAGNOSIS — N1832 Chronic kidney disease, stage 3b: Secondary | ICD-10-CM | POA: Diagnosis not present

## 2024-04-19 DIAGNOSIS — Z87891 Personal history of nicotine dependence: Secondary | ICD-10-CM | POA: Diagnosis not present

## 2024-04-19 DIAGNOSIS — E1151 Type 2 diabetes mellitus with diabetic peripheral angiopathy without gangrene: Secondary | ICD-10-CM | POA: Diagnosis not present

## 2024-04-19 DIAGNOSIS — K219 Gastro-esophageal reflux disease without esophagitis: Secondary | ICD-10-CM | POA: Diagnosis not present

## 2024-04-19 DIAGNOSIS — J449 Chronic obstructive pulmonary disease, unspecified: Secondary | ICD-10-CM | POA: Diagnosis not present

## 2024-04-19 DIAGNOSIS — I129 Hypertensive chronic kidney disease with stage 1 through stage 4 chronic kidney disease, or unspecified chronic kidney disease: Secondary | ICD-10-CM | POA: Diagnosis not present

## 2024-04-19 DIAGNOSIS — D6869 Other thrombophilia: Secondary | ICD-10-CM | POA: Diagnosis not present

## 2024-04-19 DIAGNOSIS — Z9713 Presence of artificial right leg (complete) (partial): Secondary | ICD-10-CM | POA: Diagnosis not present

## 2024-04-19 DIAGNOSIS — Z89511 Acquired absence of right leg below knee: Secondary | ICD-10-CM | POA: Diagnosis not present

## 2024-04-19 DIAGNOSIS — I480 Paroxysmal atrial fibrillation: Secondary | ICD-10-CM | POA: Diagnosis not present

## 2024-04-19 DIAGNOSIS — E039 Hypothyroidism, unspecified: Secondary | ICD-10-CM | POA: Diagnosis not present

## 2024-04-19 NOTE — Telephone Encounter (Signed)
 [phone] Gracey from [facility] states that they recommend due to his decreased GFR they recommend adding SGL2.    She was advised that we will need a fax regarding this.  Due to HIPAA

## 2024-04-19 NOTE — Telephone Encounter (Signed)
 Copied from CRM #[id number]. Topic: Medication/Rx - Rx Clarification/Change  >> Apr 19, 2024  3:15 PM Christofferson A wrote: Gracie-NP is calling for medication request (Obtain: Medication Name, Dosage, Quantity, Frequency, Quantity Requested (example: 30-day supply.)   Include all details related to the request(s) below:  Caller requesting call back to advise on medication change recommendations following a recent med review with the pharmacist   Confirm and type the Best Contact Number below:  Patient/caller contact number:          [phone]   [] Home  [x] Mobile  [] Work [] Other   [x] Okay to leave a voicemail   Medication List:  Current Outpatient Medications:  .  amiodarone (PACERONE) 200 mg tablet, TAKE 1 TABLET BY MOUTH EVERY DAY, Disp: 90 tablet, Rfl: 0 .  amLODIPine (NORVASC) 5 mg tablet, Take 1 tablet (5 mg total) by mouth daily., Disp: 30 tablet, Rfl: 1 .  atorvastatin (LIPITOR) 80 mg tablet, Take 1 tablet (80 mg total) by mouth daily. FOR CHOLESTEROL, Disp: 30 tablet, Rfl: 1 .  ezetimibe (ZETIA) 10 mg tablet, Take 10 mg by mouth daily., Disp: , Rfl:  .  FreeStyle Libre 2 Sensor kit, USE AS DIRECTED. CHANGE EVERY 14 DAYS, Disp: 2 kit, Rfl: 2 .  furosemide (LASIX) 20 mg tablet, Take 1 tablet (20 mg total) by mouth daily., Disp: 90 tablet, Rfl: 0 .  HumuLIN 70/30 U-100 Insulin 100 unit/mL (70-30) injection, INJECT 45 UNITS IN THE MORNING AND 35 UNITS IN THE EVENING AS DIRECTED, Disp: , Rfl:  .  levothyroxine (SYNTHROID) 125 mcg tablet, Take 125 mcg by mouth every morning., Disp: , Rfl:  .  magnesium oxide 400 mg (241 mg magnesium) tab, Take 400 mg by mouth daily., Disp: , Rfl:  .  naloxone (NARCAN) 4 mg/actuation spry nasal spray, Spray the contents of 1 device into 1 nostril. Call 911. May repeat with 2nd device in alternate nostril if no response in 2-3 minutes., Disp: 2 each, Rfl: 0 .  naloxone (Narcan) 4 mg/actuation spry nasal spray, Spray the contents of 1 device into 1  nostril. Call 911. May repeat with 2nd device in alternate nostril if no response in 2-3 minutes., Disp: 2 each, Rfl: 0 .  omeprazole (PriLOSEC) 20 mg DR capsule, Take 1 capsule by mouth daily., Disp: , Rfl:  .  potassium chloride (KLOR-CON) 20 mEq packet, ATKE 1 (ONE) PACKET TWO TIMES DAILY AFTER MEALS FOR POTASSIUM, Disp: , Rfl:  .  tamsulosin  (FLOMAX ) 0.4 mg cap, Take 0.4 mg by mouth daily., Disp: , Rfl:  .  Xarelto 20 mg tablet, Take 1 tablet (20 mg total) by mouth daily with dinner., Disp: 90 tablet, Rfl: 1     Medication Request/Refills: Pharmacy Information (if applicable)   [] Not Applicable       []  Pharmacy listed  Send Medication Request to:                                                 [] Pharmacy not listed (added to pharmacy list in Epic) Send Medication Request to:      Listed Pharmacies: [id number] [place], [place] - [address] - PHONE: [phone] - FAX: [phone] [facility] [place], [place] - [address] - PHONE: [phone] - FAX: [phone]

## 2024-05-09 NOTE — Progress Notes (Signed)
 Spoke with Patient today to discuss appointments and health maintenance screenings due for the upcoming year.     Appointments which have been scheduled    Dec 22, 2024 8:00 AM Vascular Ultrasound with [facility] DIAG [address] RM 2 [facility]) (--) [address], [address] [place] [address] [phone]   Dec 26, 2024 8:30 AM Follow Up Appointment with Elouise VEAR Servant, MD [facility]) (--) [address], [address] [place] [address] [phone]       Care connections to place referral for N/A.  Health maintenance topics discussed and/or scheduled: Medicare Annual Wellness Visit  Visits scheduled: NA  Comments: patient has non [facility]

## 2024-05-25 NOTE — Telephone Encounter (Signed)
 Copied from CRM #[id number]. Topic: Medication/Rx - Rx Refill >> May 25, 2024 11:26 AM Terry Nguyen wrote: Terry Nguyen is calling for medication request (Obtain: Medication Name, Dosage, Quantity, Frequency, Quantity Requested (example: 30-day supply.)   Include all details related to the request(s) below:  Requesting refill on  HumuLIN 70/30 U-100 Insulin 100 unit/mL (70-30) injection     Confirm and type the Best Contact Number below:  Patient/caller contact number:    Terry Nguyen [phone]         [] Home  [x] Mobile  [] Work [] Other   [x] Okay to leave a voicemail   Medication List:  Current Outpatient Medications:  .  amiodarone (PACERONE) 200 mg tablet, TAKE 1 TABLET BY MOUTH EVERY DAY, Disp: 90 tablet, Rfl: 0 .  amLODIPine (NORVASC) 5 mg tablet, TAKE 1 TABLET (5 MG TOTAL) BY MOUTH DAILY., Disp: 90 tablet, Rfl: 0 .  atorvastatin (LIPITOR) 80 mg tablet, Take 1 tablet (80 mg total) by mouth daily. FOR CHOLESTEROL, Disp: 30 tablet, Rfl: 1 .  ezetimibe (ZETIA) 10 mg tablet, Take 10 mg by mouth daily., Disp: , Rfl:  .  FreeStyle Libre 2 Sensor kit, USE AS DIRECTED. CHANGE EVERY 14 DAYS, Disp: 2 kit, Rfl: 2 .  furosemide (LASIX) 20 mg tablet, Take 1 tablet (20 mg total) by mouth daily., Disp: 90 tablet, Rfl: 0 .  HumuLIN 70/30 U-100 Insulin 100 unit/mL (70-30) injection, INJECT 45 UNITS IN THE MORNING AND 35 UNITS IN THE EVENING AS DIRECTED, Disp: , Rfl:  .  levothyroxine (SYNTHROID) 125 mcg tablet, Take 125 mcg by mouth every morning., Disp: , Rfl:  .  magnesium oxide 400 mg (241 mg magnesium) tab, Take 400 mg by mouth daily., Disp: , Rfl:  .  naloxone (NARCAN) 4 mg/actuation spry nasal spray, Spray the contents of 1 device into 1 nostril. Call 911. May repeat with 2nd device in alternate nostril if no response in 2-3 minutes., Disp: 2 each, Rfl: 0 .  naloxone (Narcan) 4 mg/actuation spry nasal spray, Spray the contents of 1 device into 1 nostril. Call 911. May repeat with 2nd device in  alternate nostril if no response in 2-3 minutes., Disp: 2 each, Rfl: 0 .  omeprazole (PriLOSEC) 20 mg DR capsule, Take 1 capsule by mouth daily., Disp: , Rfl:  .  potassium chloride (KLOR-CON) 20 mEq packet, ATKE 1 (ONE) PACKET TWO TIMES DAILY AFTER MEALS FOR POTASSIUM, Disp: , Rfl:  .  tamsulosin  (FLOMAX ) 0.4 mg cap, Take 0.4 mg by mouth daily., Disp: , Rfl:  .  Xarelto 20 mg tablet, Take 1 tablet (20 mg total) by mouth daily with dinner., Disp: 90 tablet, Rfl: 1     Medication Request/Refills: Pharmacy Information (if applicable)   [] Not Applicable       [x]  Pharmacy listed  Send [facility] Request [phone] [place] [place] - [address] - PHONE: [phone] - FAX: [phone]                                                 [] Pharmacy not listed (added to pharmacy list in Epic) Send Medication Request to:      Listed Pharmacies: [id number] [place] - [address] - PHONE: [phone] - FAX: [phone] [facility] [place], [place] - [address] - PHONE: [phone] -  FAX: [phone]

## 2024-05-26 NOTE — Telephone Encounter (Signed)
 Attempted to call patient at 1:34 p.m and his voice mail box has not been set up.  Will attempt to call patient later in the afternoon.

## 2024-05-26 NOTE — Telephone Encounter (Signed)
 Attempted to call patient at 4:54 p.m and voice mail has not been set up.

## 2024-05-26 NOTE — Telephone Encounter (Signed)
 This has not been filled by us  but I did it says 45 units in am and 35 in the pm verify this is correct - I refilled it that way

## 2024-05-30 NOTE — Telephone Encounter (Signed)
 Attempted to call patient at 5:16 p.m will try again in the a.m.

## 2024-05-31 NOTE — Telephone Encounter (Signed)
 Still no answer and vm is not set up
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
## 2024-06-11 NOTE — Progress Notes (Signed)
 A1C - stable and close to goal; continue current regimen and low sugar/carb diet  TSH and T4 - slightly elevated.  Please confirm that he has not had any missed doses of his medication.  If not, we will need to recheck.  CMP - elevated glucose and persistent abnormal renal function.  Also slightly low calcium - make sure he is getting enough in his diet.  Would he be OK if we sent him to an Endocrinologist for further management of his blood sugar while on insulin?  CBC - mild, stable anemia

## 2024-06-15 NOTE — Progress Notes (Signed)
 Referral sent

## 2024-06-15 NOTE — Telephone Encounter (Signed)
 Patient calling back. States ,did miss 3 days of thyroid medicine ,ran out of refills.. But back on track now. Also will be fine seeing endocrinologist . Advise patient on labs.

## 2024-07-11 NOTE — Telephone Encounter (Signed)
 Patient requested to have his oxyCODONE-acetaminophen prescription refilled. Best contact number is [phone].

## 2024-08-11 NOTE — Telephone Encounter (Signed)
 Copied from CRM #[id number]. Topic: Medication/Rx - Rx Refill >> Aug 11, 2024  1:36 PM Alisa DEL wrote: Refill request for oxyCODONE-acetaminophen (PERCOCET) 10-325 mg per tablet and levothyroxine (SYNTHROID) 125 mcg tablet   [id number]   [facility], [place]  Patient / [phone] / message

## 2024-08-11 NOTE — Telephone Encounter (Signed)
 sent

## 2024-08-29 DIAGNOSIS — H25013 Cortical age-related cataract, bilateral: Secondary | ICD-10-CM | POA: Diagnosis not present

## 2024-08-29 DIAGNOSIS — H2511 Age-related nuclear cataract, right eye: Secondary | ICD-10-CM | POA: Diagnosis not present

## 2024-08-29 DIAGNOSIS — E119 Type 2 diabetes mellitus without complications: Secondary | ICD-10-CM | POA: Diagnosis not present

## 2024-08-29 DIAGNOSIS — H25043 Posterior subcapsular polar age-related cataract, bilateral: Secondary | ICD-10-CM | POA: Diagnosis not present

## 2024-08-29 DIAGNOSIS — H2513 Age-related nuclear cataract, bilateral: Secondary | ICD-10-CM | POA: Diagnosis not present

## 2024-09-01 ENCOUNTER — Encounter: Payer: Self-pay | Admitting: Family Medicine

## 2024-09-01 NOTE — Progress Notes (Addendum)
 Pharmacy Quality Measure Review  This patient is appearing on a report for being at risk of failing the adherence measure for cholesterol (statin) medications this calendar year.   Medication: Atorvastatin 80mg  Last fill date: 10/25 for 90 day supply  Insurance report was not up to date. No action needed at this time. Of note, patient appears to be receiving primary care services from [facility]. Atorvastatin is not on medication list, but is taking still based on refill history.  Angela Baalmann, PharmD [facility] Pharmacist

## 2024-09-15 DIAGNOSIS — H2511 Age-related nuclear cataract, right eye: Secondary | ICD-10-CM | POA: Diagnosis not present

## 2024-09-15 DIAGNOSIS — H5371 Glare sensitivity: Secondary | ICD-10-CM | POA: Diagnosis not present

## 2024-09-16 DIAGNOSIS — H25012 Cortical age-related cataract, left eye: Secondary | ICD-10-CM | POA: Diagnosis not present

## 2024-09-16 DIAGNOSIS — H25042 Posterior subcapsular polar age-related cataract, left eye: Secondary | ICD-10-CM | POA: Diagnosis not present

## 2024-09-16 DIAGNOSIS — H2512 Age-related nuclear cataract, left eye: Secondary | ICD-10-CM | POA: Diagnosis not present

## 2024-10-06 DIAGNOSIS — H2512 Age-related nuclear cataract, left eye: Secondary | ICD-10-CM | POA: Diagnosis not present

## 2024-10-06 DIAGNOSIS — H5371 Glare sensitivity: Secondary | ICD-10-CM | POA: Diagnosis not present

## 2024-10-10 DIAGNOSIS — Z87891 Personal history of nicotine dependence: Secondary | ICD-10-CM | POA: Diagnosis not present

## 2024-10-10 DIAGNOSIS — R600 Localized edema: Secondary | ICD-10-CM | POA: Diagnosis not present

## 2024-10-10 DIAGNOSIS — E039 Hypothyroidism, unspecified: Secondary | ICD-10-CM | POA: Diagnosis not present

## 2024-10-10 DIAGNOSIS — G894 Chronic pain syndrome: Secondary | ICD-10-CM | POA: Diagnosis not present

## 2024-10-10 DIAGNOSIS — E785 Hyperlipidemia, unspecified: Secondary | ICD-10-CM | POA: Diagnosis not present

## 2024-10-10 DIAGNOSIS — Z794 Long term (current) use of insulin: Secondary | ICD-10-CM | POA: Diagnosis not present

## 2024-10-10 DIAGNOSIS — E119 Type 2 diabetes mellitus without complications: Secondary | ICD-10-CM | POA: Diagnosis not present

## 2024-10-10 DIAGNOSIS — I10 Essential (primary) hypertension: Secondary | ICD-10-CM | POA: Diagnosis not present

## 2024-10-10 DIAGNOSIS — I480 Paroxysmal atrial fibrillation: Secondary | ICD-10-CM | POA: Diagnosis not present

## 2024-10-10 DIAGNOSIS — Z23 Encounter for immunization: Secondary | ICD-10-CM | POA: Diagnosis not present

## 2024-10-10 DIAGNOSIS — Z0000 Encounter for general adult medical examination without abnormal findings: Secondary | ICD-10-CM | POA: Diagnosis not present

## 2024-10-10 DIAGNOSIS — Z1211 Encounter for screening for malignant neoplasm of colon: Secondary | ICD-10-CM | POA: Diagnosis not present

## 2024-10-10 DIAGNOSIS — N400 Enlarged prostate without lower urinary tract symptoms: Secondary | ICD-10-CM | POA: Diagnosis not present

## 2024-10-10 DIAGNOSIS — Z125 Encounter for screening for malignant neoplasm of prostate: Secondary | ICD-10-CM | POA: Diagnosis not present

## 2024-10-10 DIAGNOSIS — I739 Peripheral vascular disease, unspecified: Secondary | ICD-10-CM | POA: Diagnosis not present

## 2024-10-10 DIAGNOSIS — K219 Gastro-esophageal reflux disease without esophagitis: Secondary | ICD-10-CM | POA: Diagnosis not present
